# Patient Record
Sex: Male | Born: 1935 | Race: White | Hispanic: No | Smoking: Never smoker
Health system: Southern US, Community
[De-identification: ages and names within clinical notes are randomized; demographics above are authoritative.]

## PROBLEM LIST (undated history)

## (undated) DIAGNOSIS — N200 Calculus of kidney: Secondary | ICD-10-CM

## (undated) DIAGNOSIS — C189 Malignant neoplasm of colon, unspecified: Secondary | ICD-10-CM

## (undated) DIAGNOSIS — R634 Abnormal weight loss: Secondary | ICD-10-CM

## (undated) DIAGNOSIS — E785 Hyperlipidemia, unspecified: Secondary | ICD-10-CM

## (undated) DIAGNOSIS — C61 Malignant neoplasm of prostate: Secondary | ICD-10-CM

## (undated) DIAGNOSIS — I2510 Atherosclerotic heart disease of native coronary artery without angina pectoris: Secondary | ICD-10-CM

## (undated) DIAGNOSIS — I499 Cardiac arrhythmia, unspecified: Secondary | ICD-10-CM

## (undated) DIAGNOSIS — K219 Gastro-esophageal reflux disease without esophagitis: Secondary | ICD-10-CM

## (undated) DIAGNOSIS — M549 Dorsalgia, unspecified: Secondary | ICD-10-CM

## (undated) DIAGNOSIS — I739 Peripheral vascular disease, unspecified: Secondary | ICD-10-CM

## (undated) DIAGNOSIS — I10 Essential (primary) hypertension: Secondary | ICD-10-CM

## (undated) DIAGNOSIS — G4730 Sleep apnea, unspecified: Secondary | ICD-10-CM

## (undated) DIAGNOSIS — D649 Anemia, unspecified: Secondary | ICD-10-CM

## (undated) DIAGNOSIS — D509 Iron deficiency anemia, unspecified: Secondary | ICD-10-CM

## (undated) HISTORY — DX: Iron deficiency anemia, unspecified: D50.9

## (undated) HISTORY — DX: Abnormal weight loss: R63.4

## (undated) HISTORY — DX: Cardiac arrhythmia, unspecified: I49.9

## (undated) HISTORY — DX: Hyperlipidemia, unspecified: E78.5

## (undated) HISTORY — DX: Sleep apnea, unspecified: G47.30

## (undated) HISTORY — DX: Atherosclerotic heart disease of native coronary artery without angina pectoris: I25.10

## (undated) HISTORY — DX: Peripheral vascular disease, unspecified: I73.9

## (undated) HISTORY — PX: COLONOSCOPY: SHX174

## (undated) HISTORY — PX: KIDNEY STONE SURGERY: SHX686

## (undated) HISTORY — DX: Dorsalgia, unspecified: M54.9

## (undated) HISTORY — PX: APPENDECTOMY: SHX54

## (undated) HISTORY — DX: Calculus of kidney: N20.0

## (undated) HISTORY — DX: Malignant neoplasm of colon, unspecified: C18.9

## (undated) HISTORY — DX: Malignant neoplasm of prostate: C61

## (undated) HISTORY — PX: HEMORROIDECTOMY: SUR656

## (undated) HISTORY — PX: OTHER SURGICAL HISTORY: SHX169

## (undated) HISTORY — DX: Gastro-esophageal reflux disease without esophagitis: K21.9

## (undated) HISTORY — PX: CATARACT EXTRACTION: SUR2

## (undated) HISTORY — PX: CHOLECYSTECTOMY: SHX55

## (undated) HISTORY — PX: ANAL FISSURECTOMY: SUR608

## (undated) HISTORY — DX: Anemia, unspecified: D64.9

---

## 2009-04-29 ENCOUNTER — Ambulatory Visit: Payer: Self-pay | Admitting: Sports Medicine

## 2009-04-29 DIAGNOSIS — I714 Abdominal aortic aneurysm, without rupture, unspecified: Secondary | ICD-10-CM | POA: Insufficient documentation

## 2009-04-29 DIAGNOSIS — D518 Other vitamin B12 deficiency anemias: Secondary | ICD-10-CM

## 2009-04-29 DIAGNOSIS — M549 Dorsalgia, unspecified: Secondary | ICD-10-CM | POA: Insufficient documentation

## 2009-05-01 ENCOUNTER — Encounter: Payer: Self-pay | Admitting: Sports Medicine

## 2009-05-01 DIAGNOSIS — M1990 Unspecified osteoarthritis, unspecified site: Secondary | ICD-10-CM | POA: Insufficient documentation

## 2009-05-01 DIAGNOSIS — I10 Essential (primary) hypertension: Secondary | ICD-10-CM | POA: Insufficient documentation

## 2009-05-01 DIAGNOSIS — E119 Type 2 diabetes mellitus without complications: Secondary | ICD-10-CM | POA: Insufficient documentation

## 2009-05-01 DIAGNOSIS — Z8719 Personal history of other diseases of the digestive system: Secondary | ICD-10-CM

## 2009-05-01 DIAGNOSIS — E1169 Type 2 diabetes mellitus with other specified complication: Secondary | ICD-10-CM

## 2009-05-01 DIAGNOSIS — D509 Iron deficiency anemia, unspecified: Secondary | ICD-10-CM | POA: Insufficient documentation

## 2009-05-01 DIAGNOSIS — I2510 Atherosclerotic heart disease of native coronary artery without angina pectoris: Secondary | ICD-10-CM

## 2009-05-01 DIAGNOSIS — E785 Hyperlipidemia, unspecified: Secondary | ICD-10-CM

## 2009-05-01 DIAGNOSIS — M545 Low back pain: Secondary | ICD-10-CM

## 2009-06-04 ENCOUNTER — Ambulatory Visit: Payer: Self-pay | Admitting: Sports Medicine

## 2009-06-04 DIAGNOSIS — G252 Other specified forms of tremor: Secondary | ICD-10-CM

## 2009-06-04 DIAGNOSIS — G250 Essential tremor: Secondary | ICD-10-CM

## 2011-04-10 ENCOUNTER — Emergency Department (HOSPITAL_BASED_OUTPATIENT_CLINIC_OR_DEPARTMENT_OTHER)
Admission: EM | Admit: 2011-04-10 | Discharge: 2011-04-11 | Disposition: A | Discharge status: Other Acute Inpatient Hospital | Payer: PRIVATE HEALTH INSURANCE | Attending: Emergency Medicine | Admitting: Emergency Medicine

## 2011-04-10 ENCOUNTER — Emergency Department (INDEPENDENT_AMBULATORY_CARE_PROVIDER_SITE_OTHER): Payer: PRIVATE HEALTH INSURANCE

## 2011-04-10 ENCOUNTER — Encounter: Payer: Self-pay | Admitting: Emergency Medicine

## 2011-04-10 ENCOUNTER — Other Ambulatory Visit: Payer: Self-pay

## 2011-04-10 DIAGNOSIS — E119 Type 2 diabetes mellitus without complications: Secondary | ICD-10-CM | POA: Insufficient documentation

## 2011-04-10 DIAGNOSIS — R5381 Other malaise: Secondary | ICD-10-CM | POA: Insufficient documentation

## 2011-04-10 DIAGNOSIS — R5383 Other fatigue: Secondary | ICD-10-CM | POA: Insufficient documentation

## 2011-04-10 DIAGNOSIS — J189 Pneumonia, unspecified organism: Secondary | ICD-10-CM | POA: Insufficient documentation

## 2011-04-10 DIAGNOSIS — I10 Essential (primary) hypertension: Secondary | ICD-10-CM | POA: Insufficient documentation

## 2011-04-10 HISTORY — DX: Essential (primary) hypertension: I10

## 2011-04-10 LAB — URINALYSIS, ROUTINE W REFLEX MICROSCOPIC
Bilirubin Urine: NEGATIVE
Hgb urine dipstick: NEGATIVE
Ketones, ur: 40 mg/dL — AB
Nitrite: NEGATIVE
pH: 5 (ref 5.0–8.0)

## 2011-04-10 LAB — CBC
HCT: 40.5 % (ref 39.0–52.0)
MCHC: 34.6 g/dL (ref 30.0–36.0)
MCV: 90.2 fL (ref 78.0–100.0)
RDW: 13.1 % (ref 11.5–15.5)

## 2011-04-10 LAB — BASIC METABOLIC PANEL
BUN: 25 mg/dL — ABNORMAL HIGH (ref 6–23)
Chloride: 101 mEq/L (ref 96–112)
Creatinine, Ser: 1.2 mg/dL (ref 0.50–1.35)
GFR calc Af Amer: >60 mL/min (ref >60)
GFR calc non Af Amer: 58 mL/min — ABNORMAL LOW (ref >60)
Glucose, Bld: 200 mg/dL — ABNORMAL HIGH (ref 70–99)

## 2011-04-10 MED ORDER — SODIUM CHLORIDE 0.9 % IV BOLUS (SEPSIS)
500.0000 mL | Freq: Once | INTRAVENOUS | Status: AC
  Administered 2011-04-10: 500 mL via INTRAVENOUS

## 2011-04-10 MED ORDER — DEXTROSE 5 % IV SOLN
1.0000 g | Freq: Once | INTRAVENOUS | Status: AC
  Administered 2011-04-10: 1 g via INTRAVENOUS
  Filled 2011-04-10: qty 1

## 2011-04-10 MED ORDER — DEXTROSE 5 % IV SOLN
500.0000 mg | Freq: Once | INTRAVENOUS | Status: AC
  Administered 2011-04-10: 500 mg via INTRAVENOUS
  Filled 2011-04-10: qty 500

## 2011-04-10 MED ORDER — ACETAMINOPHEN 325 MG PO TABS
650.0000 mg | ORAL_TABLET | Freq: Once | ORAL | Status: AC
  Administered 2011-04-10: 650 mg via ORAL
  Filled 2011-04-10: qty 2

## 2011-04-10 NOTE — ED Notes (Signed)
MD at bedside. 

## 2011-04-10 NOTE — ED Notes (Signed)
Per EMS: cbg 205; EKG normal; 170/68, HR 88

## 2011-04-10 NOTE — ED Notes (Signed)
Pt c/o  Weakness, nausea, vomiting x 2 & diaphoresis onset 2 hrs ago; denies CP or SHOB

## 2011-04-10 NOTE — ED Provider Notes (Signed)
History     CSN: [id number] Arrival date & time: 04/10/2011  8:04 PM  Chief Complaint  Patient presents with  . Weakness   The history is provided by the patient.   Chief complaint: weakness and "shaking"  Onset: an hour ago Course - improving Worsened by: nothing Improved by eating food  Pt denies cp/sob/headache/abd pain/fever/vomiting/diarrhea  Pt reports he was at home and just "felt shaking" but no sz, no altered mental status Improved with taking PO - he is unsure his glucose at that time.   He reports decreased appetite today Past Medical History  Diagnosis Date  . Hypertension   . Diabetes mellitus     Past Surgical History  Procedure Date  . Appendectomy     No family history on file.  History  Substance Use Topics  . Smoking status: Never Smoker   . Smokeless tobacco: Not on file  . Alcohol Use: No      Review of Systems  All other systems reviewed and are negative.    Physical Exam  BP 152/62  Pulse 89  Temp(Src) 99.2 F (37.3 C) (Oral)  Resp 20  SpO2 100%   Physical Exam  CONSTITUTIONAL: Well developed/well nourished HEAD AND FACE: Normocephalic/atraumatic EYES: EOMI/PERRL ENMT: Mucous membranes moist NECK: supple no meningeal signs SPINE:entire spine nontender CV: S1/S2 noted, no murmurs/rubs/gallops noted LUNGS: Lungs are clear to auscultation bilaterally, no apparent distress ABDOMEN: soft, nontender, no rebound or guarding GU:no cva tenderness NEURO: Pt is awake/alert, moves all extremitiesx4 EXTREMITIES: pulses normal, full ROM SKIN: warm, color normal PSYCH: no abnormalities of mood noted  ED Course  Procedures  MDM Nursing notes reviewed and considered in documentation xrays reviewed and considered All labs/vitals reviewed and considered     Date: 04/10/2011  Rate: 89  Rhythm: normal sinus rhythm  QRS Axis: normal  Intervals: normal  ST/T Wave abnormalities: nonspecific ST changes  Conduction  Disutrbances:none  Narrative Interpretation:   Old EKG Reviewed: none available   Pt appears improved, though his CURB65 score elevated (2)which places him at high risk for mortality Pt slightly tachypneic, but not hypotensive, able to speak clearly I advised admission and he agreed D/w dr Lowell Guitar, at [facility], and he accepts for admission    Joya Gaskins, MD 04/10/11 2323

## 2011-04-10 NOTE — ED Notes (Signed)
Vital signs stable. 

## 2011-04-10 NOTE — ED Notes (Signed)
Patient is resting comfortably.Benefits of admission reviewed with pt and friends at side. IV infusion in progress.

## 2011-04-11 NOTE — ED Notes (Signed)
Pt verbalize3s benefits of Transfer and treatment

## 2011-06-14 ENCOUNTER — Ambulatory Visit: Payer: PRIVATE HEALTH INSURANCE | Attending: Internal Medicine | Admitting: Physical Therapy

## 2011-06-14 DIAGNOSIS — M6281 Muscle weakness (generalized): Secondary | ICD-10-CM | POA: Insufficient documentation

## 2011-06-14 DIAGNOSIS — M545 Low back pain, unspecified: Secondary | ICD-10-CM | POA: Insufficient documentation

## 2011-06-14 DIAGNOSIS — IMO0001 Reserved for inherently not codable concepts without codable children: Secondary | ICD-10-CM | POA: Insufficient documentation

## 2011-06-14 DIAGNOSIS — R293 Abnormal posture: Secondary | ICD-10-CM | POA: Insufficient documentation

## 2011-06-18 ENCOUNTER — Ambulatory Visit: Payer: PRIVATE HEALTH INSURANCE | Admitting: Physical Therapy

## 2011-06-22 ENCOUNTER — Ambulatory Visit: Payer: PRIVATE HEALTH INSURANCE | Admitting: Physical Therapy

## 2011-06-24 ENCOUNTER — Ambulatory Visit: Payer: PRIVATE HEALTH INSURANCE | Admitting: Physical Therapy

## 2011-06-29 ENCOUNTER — Ambulatory Visit: Payer: PRIVATE HEALTH INSURANCE | Admitting: Physical Therapy

## 2011-07-01 ENCOUNTER — Ambulatory Visit: Payer: PRIVATE HEALTH INSURANCE | Admitting: Physical Therapy

## 2011-07-06 ENCOUNTER — Ambulatory Visit: Payer: PRIVATE HEALTH INSURANCE | Admitting: Physical Therapy

## 2011-07-09 ENCOUNTER — Ambulatory Visit: Payer: PRIVATE HEALTH INSURANCE | Attending: Internal Medicine | Admitting: Physical Therapy

## 2011-07-09 DIAGNOSIS — R293 Abnormal posture: Secondary | ICD-10-CM | POA: Insufficient documentation

## 2011-07-09 DIAGNOSIS — IMO0001 Reserved for inherently not codable concepts without codable children: Secondary | ICD-10-CM | POA: Insufficient documentation

## 2011-07-09 DIAGNOSIS — M545 Low back pain, unspecified: Secondary | ICD-10-CM | POA: Insufficient documentation

## 2011-07-09 DIAGNOSIS — M6281 Muscle weakness (generalized): Secondary | ICD-10-CM | POA: Insufficient documentation

## 2011-07-13 ENCOUNTER — Ambulatory Visit: Payer: PRIVATE HEALTH INSURANCE | Admitting: Physical Therapy

## 2011-07-15 ENCOUNTER — Ambulatory Visit: Payer: PRIVATE HEALTH INSURANCE | Admitting: Physical Therapy

## 2011-07-19 ENCOUNTER — Ambulatory Visit: Payer: PRIVATE HEALTH INSURANCE | Admitting: Physical Therapy

## 2011-07-23 ENCOUNTER — Ambulatory Visit: Payer: PRIVATE HEALTH INSURANCE | Admitting: Physical Therapy

## 2013-01-16 IMAGING — CR DG CHEST 2V
2 series · 2 of 2 positions shown · non-contrast
Comparison: None.

CLINICAL DATA: Weakness.

CHEST - 2 VIEW

[w chest pa]
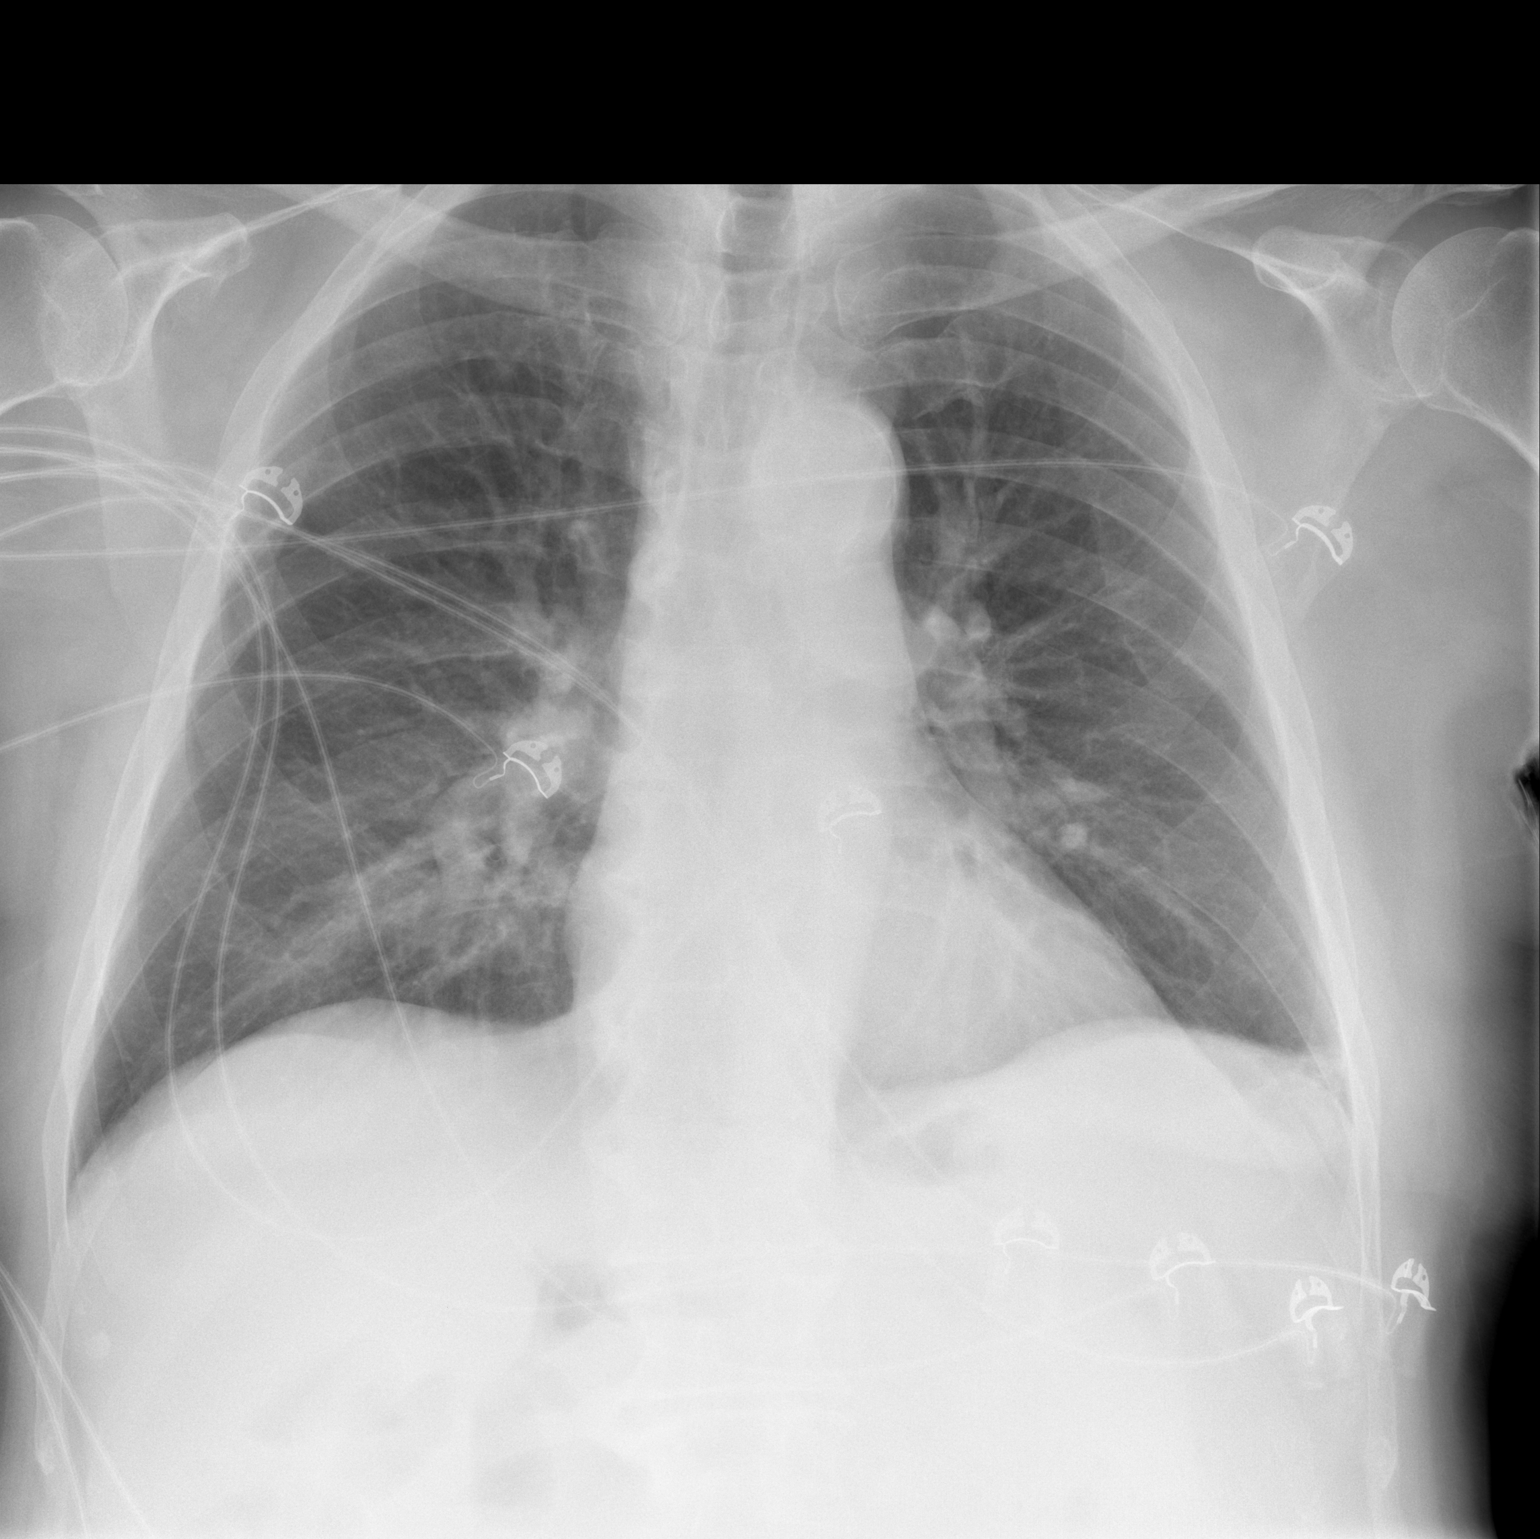

[w chest lat]
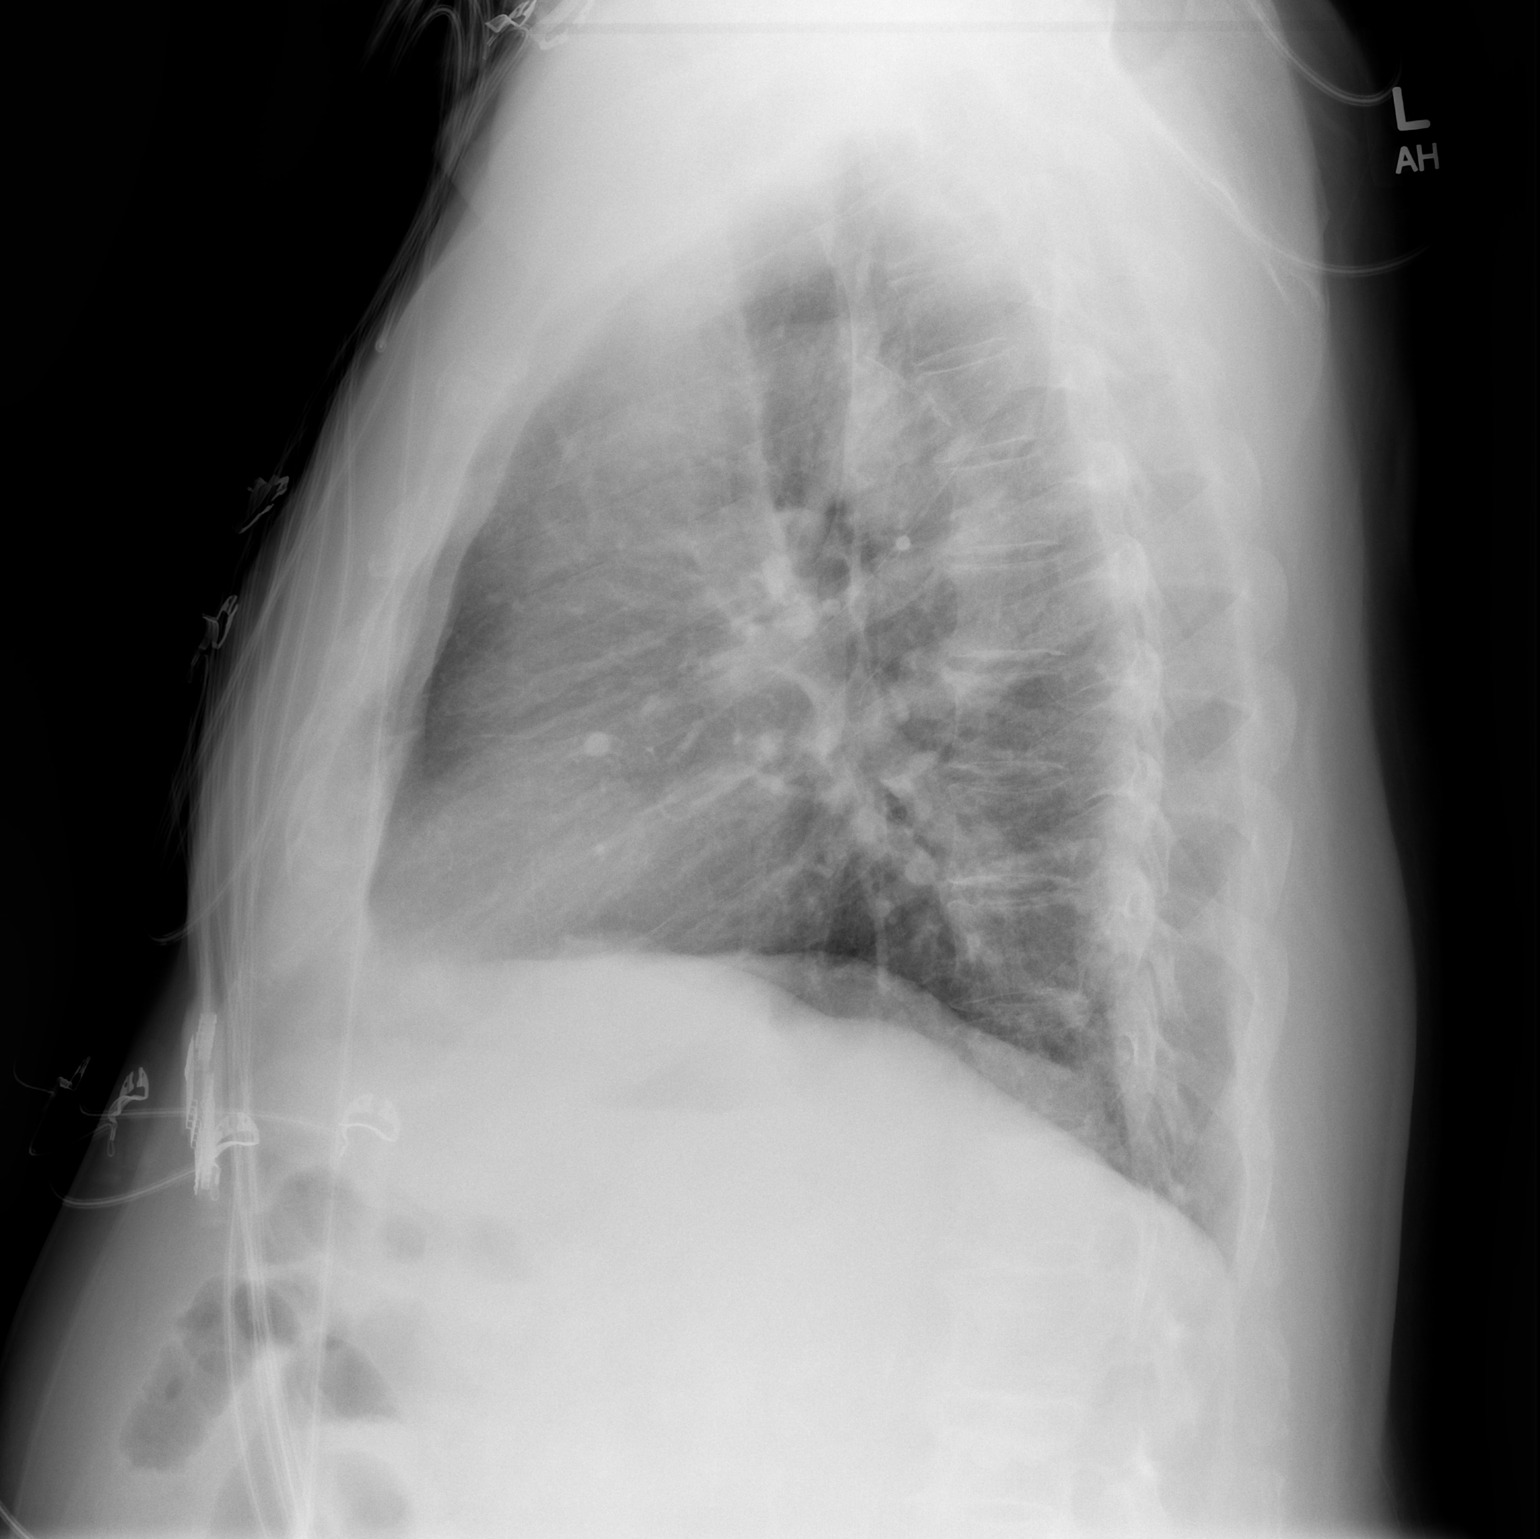

[2 of 2 positions shown; findings below may reference images not displayed]

FINDINGS: Heart is normal size.  Question right lower lobe airspace
disease.  Left lung is clear.  No effusions or acute bony
abnormality.
IMPRESSION: Question right lower lobe infiltrate.

## 2018-06-02 DIAGNOSIS — K8000 Calculus of gallbladder with acute cholecystitis without obstruction: Secondary | ICD-10-CM | POA: Insufficient documentation

## 2018-06-19 ENCOUNTER — Non-Acute Institutional Stay (SKILLED_NURSING_FACILITY): Payer: Medicare Other | Admitting: Internal Medicine

## 2018-06-19 ENCOUNTER — Encounter: Payer: Self-pay | Admitting: Internal Medicine

## 2018-06-19 DIAGNOSIS — R6520 Severe sepsis without septic shock: Secondary | ICD-10-CM

## 2018-06-19 DIAGNOSIS — A419 Sepsis, unspecified organism: Secondary | ICD-10-CM

## 2018-06-19 DIAGNOSIS — I2510 Atherosclerotic heart disease of native coronary artery without angina pectoris: Secondary | ICD-10-CM

## 2018-06-19 DIAGNOSIS — I480 Paroxysmal atrial fibrillation: Secondary | ICD-10-CM | POA: Diagnosis not present

## 2018-06-19 DIAGNOSIS — E1169 Type 2 diabetes mellitus with other specified complication: Secondary | ICD-10-CM

## 2018-06-19 DIAGNOSIS — E872 Acidosis, unspecified: Secondary | ICD-10-CM

## 2018-06-19 DIAGNOSIS — E559 Vitamin D deficiency, unspecified: Secondary | ICD-10-CM

## 2018-06-19 DIAGNOSIS — N170 Acute kidney failure with tubular necrosis: Secondary | ICD-10-CM | POA: Diagnosis not present

## 2018-06-19 DIAGNOSIS — E785 Hyperlipidemia, unspecified: Secondary | ICD-10-CM

## 2018-06-19 NOTE — Progress Notes (Signed)
:   Location:  [place] Room Number: 387F Place of Service:  SNF (31)  Irvan Tiedt D. [name], MD  Extended Emergency Contact Information Primary Emergency Contact: Fussell,James Address: [address]          [place], [place] [address] Johnnette Litter of [place] Phone: [phone] Relation: Son     Allergies: Shellfish allergy; Contrast media [iodinated diagnostic agents]; Iodine; Iodine-131; Metformin and related; and Shellfish-derived products  Chief Complaint  Patient presents with  . New Admit To SNF    Admit to [facility]    HPI: Patient is 84 y.o. male with anemia, cardiac arrhythmia, colon cancer, coronary artery disease, diabetes, GERD, hyperlipidemia, hypertension, malignant neoplasm of the prostate, sleep apnea, who was brought to the emergency room accompanied by his son who reported complaints of nausea vomiting poor appetite and diarrhea that started 4 days prior as well as a low-grade fever with chills..  In the ED patient's liver functions were elevated notably alk phos 196 and total bili 4.8 and CT the abdomen pelvis showed a dilated gallbladder with multiple stones and wall thickening and peri-cholestitis changes.  Patient was admitted to [facility] from 9/26 and discharged from [facility] on 10/11 where he was treated for severe sepsis secondary to acute cholecystitis.  He was hypotensive, tachycardic, low-grade fever, with acute kidney injury on presentation; blood cultures were collected and negative patient received 2 L IV fluid bolus and started on empiric IV Zosyn till 10/10 and sepsis resolved.  Patient had acute cholecystitis with biliary calculus.  A cholecystostomy tube was placed on 9/27, MRCP done on 10/7 which showed a tiny filling defect within the common bile duct and ERCP was done on 10/9 with sphincterotomy/papillotomy and removal of small stones.  Hospital course was comp gated by acute kidney injury,  likely ATN in the setting of sepsis with an accompanying metabolic acidosis.  Patient had a PermCath placed on 10/1 and had hemodialysis daily for 3 days with improvement.  Patient is admitted to skilled nursing facility for OT/PT and for care of biliary tube.  While at skilled nursing facility patient will be followed for hyperlipidemia treated with Lipitor, diabetes mellitus treated with Januvia, and a fibrillation treated with Xarelto and metoprolol.  Past Medical History:  Diagnosis Date  . Abnormal weight loss   . Anemia   . Arrhythmia   . Back pain   . CAD (coronary artery disease)   . Colon cancer ([place])   . Diabetes mellitus   . Gastroesophageal reflux   . Hyperlipidemia   . Hypertension   . Iron deficiency anemia   . Malignant neoplasm of prostate ([place])   . Nephrolithiasis   . Peripheral vascular disease ([place])   . Sleep apnea     Past Surgical History:  Procedure Laterality Date  . ANAL FISSURECTOMY    . APPENDECTOMY    . CATARACT EXTRACTION    . CHOLECYSTECTOMY    . COLONOSCOPY    . HEMORROIDECTOMY    . KIDNEY STONE SURGERY    . percutaneous lithotomy    . renal lithotripsy      Allergies as of 06/19/2018      Reactions   Shellfish Allergy Nausea And Vomiting   Contrast Media [iodinated Diagnostic Agents]    Iodine    Iodine-131    Metformin And Related    Shellfish-derived Products       Medication List  Accurate as of 06/19/18  3:12 PM. Always use your most recent med list.          acetaminophen 325 MG tablet Commonly known as:  TYLENOL Take 650 mg by mouth every 6 (six) hours as needed.   atorvastatin 40 MG tablet Commonly known as:  LIPITOR Take 40 mg by mouth daily.   Cholecalciferol 1000 units tablet Take 1,000 Units by mouth daily.   cyanocobalamin 1000 MCG tablet Take 1,000 mcg by mouth daily.   magnesium oxide 400 MG tablet Commonly known as:  MAG-OX Take 400 mg by mouth daily. For 14 days   metoprolol succinate 50 MG 24  hr tablet Commonly known as:  TOPROL-XL Take 50 mg by mouth daily. Take with or immediately following a meal.   ondansetron 4 MG tablet Commonly known as:  ZOFRAN Take 4 mg by mouth every 8 (eight) hours as needed for nausea or vomiting.   polyethylene glycol packet Commonly known as:  MIRALAX / GLYCOLAX Take 17 g by mouth 2 (two) times daily.   PRESERVISION AREDS 2 Caps Take by mouth.   ramipril 10 MG capsule Commonly known as:  ALTACE Take 10 mg by mouth daily.   sitaGLIPtin 100 MG tablet Commonly known as:  JANUVIA Take 100 mg by mouth daily.   XARELTO 20 MG Tabs tablet Generic drug:  rivaroxaban Take 20 mg by mouth daily with supper.       No orders of the defined types were placed in this encounter.    There is no immunization history on file for this patient.  Social History   Tobacco Use  . Smoking status: Never Smoker  . Smokeless tobacco: Never Used  Substance Use Topics  . Alcohol use: No    Family history is   Family History  Problem Relation Age of Onset  . Cancer Mother   . Hypertension Mother       Review of Systems  DATA OBTAINED: from patient, nurse GENERAL:  no fevers, fatigue, appetite changes SKIN: No itching, or rash EYES: No eye pain, redness, discharge EARS: No earache, tinnitus, change in hearing NOSE: No congestion, drainage or bleeding  MOUTH/THROAT: No mouth or tooth pain, No sore throat RESPIRATORY: No cough, wheezing, SOB CARDIAC: No chest pain, palpitations, lower extremity edema  GI: No abdominal pain, No N/V/D or constipation, No heartburn or reflux  GU: No dysuria, frequency or urgency, or incontinence  MUSCULOSKELETAL: No unrelieved bone/joint pain NEUROLOGIC: No headache, dizziness or focal weakness PSYCHIATRIC: No c/o anxiety or sadness   Vitals:   06/19/18 1510  BP: (!) 144/76  Pulse: 67  Resp: 18  Temp: (!) 97 F (36.1 C)    SpO2 Readings from Last 1 Encounters:  04/10/11 100%   Body mass index is  29.62 kg/m.     Physical Exam  GENERAL APPEARANCE: Alert, conversant,  No acute distress.  SKIN: No diaphoresis rash HEAD: Normocephalic, atraumatic  EYES: Conjunctiva/lids clear. Pupils round, reactive. EOMs intact.  EARS: External exam WNL, canals clear. Hearing grossly normal.  NOSE: No deformity or discharge.  MOUTH/THROAT: Lips w/o lesions  RESPIRATORY: Breathing is even, unlabored. Lung sounds are clear   CARDIOVASCULAR: Heart RRR no murmurs, rubs or gallops. No peripheral edema.   GASTROINTESTINAL: Abdomen is soft, non-tender, not distended w/ normal bowel sounds.;  Gastrostomy tube GENITOURINARY: Bladder non tender, not distended  MUSCULOSKELETAL: No abnormal joints or musculature NEUROLOGIC:  Cranial nerves 2-12 grossly intact. Moves all extremities  PSYCHIATRIC: Mood and affect  appropriate to situation, no behavioral issues  Patient Active Problem List   Diagnosis Date Noted  . TREMOR, ESSENTIAL 06/04/2009  . DIABETES MELLITUS, TYPE II 05/01/2009  . HYPERLIPIDEMIA 05/01/2009  . ANEMIA-IRON DEFICIENCY 05/01/2009  . HYPERTENSION 05/01/2009  . CORONARY ARTERY DISEASE 05/01/2009  . OSTEOARTHRITIS 05/01/2009  . LOW BACK PAIN 05/01/2009  . DIVERTICULITIS, HX OF 05/01/2009  . ANEMIA, VITAMIN B12 DEFICIENCY 04/29/2009  . ABDOMINAL ANEURYSM WITHOUT MENTION OF RUPTURE 04/29/2009  . BACK PAIN 04/29/2009      Labs reviewed: Basic Metabolic Panel:    Component Value Date/Time   NA 139 04/10/2011 2034   K 3.5 04/10/2011 2034   CL 101 04/10/2011 2034   CO2 25 04/10/2011 2034   GLUCOSE 200 (H) 04/10/2011 2034   BUN 25 (H) 04/10/2011 2034   CREATININE 1.20 04/10/2011 2034   CALCIUM 9.2 04/10/2011 2034   GFRNONAA 58 (L) 04/10/2011 2034   GFRAA >60 04/10/2011 2034    No results for input(s): NA, K, CL, CO2, GLUCOSE, BUN, CREATININE, CALCIUM, MG, PHOS in the last 8760 hours. Liver Function Tests: No results for input(s): AST, ALT, ALKPHOS, BILITOT, PROT, ALBUMIN in  the last 8760 hours. No results for input(s): LIPASE, AMYLASE in the last 8760 hours. No results for input(s): AMMONIA in the last 8760 hours. CBC: No results for input(s): WBC, NEUTROABS, HGB, HCT, MCV, PLT in the last 8760 hours. Lipid No results for input(s): CHOL, HDL, LDLCALC, TRIG in the last 8760 hours.  Cardiac Enzymes: No results for input(s): CKTOTAL, CKMB, CKMBINDEX, TROPONINI in the last 8760 hours. BNP: No results for input(s): BNP in the last 8760 hours. No results found for: MICROALBUR No results found for: HGBA1C No results found for: TSH No results found for: VITAMINB12 No results found for: FOLATE No results found for: IRON, TIBC, FERRITIN  Imaging and Procedures obtained prior to SNF admission: Dg Chest 2 View  Result Date: 04/10/2011 *RADIOLOGY REPORT* Clinical Data: Weakness. CHEST - 2 VIEW Comparison:  None. Findings: Heart is normal size.  Question right lower lobe airspace disease.  Left lung is clear.  No effusions or acute bony abnormality. IMPRESSION: Question right lower lobe infiltrate. Original Report Authenticated By: Raelyn Number, M.D.    Not all labs, radiology exams or other studies done during hospitalization come through on my EPIC note; however they are reviewed by me.    Assessment and Plan  Sepsis/acute cholecystitis- patient was hypotensive bradycardic low-grade fever with AKI (ATN) on presentation; blood cultures collected and negative no leukocytosis; received 2 L IV fluid bolus was on empiric IV Zosyn until 10/10 sepsis resolved SNF- admitted for OT/PT; will follow BMP for kidney function  Acute cholecystitis/biliary calculus- dilated gallbladder with multiple stones within an wall thickening, pericolic cystic inflammatory changes suggestive of acute cholecystitis; LFT elevated in particular T bili and alkaline phosphatase GI and EGS consulted and followed, no acute surgical intervention recommended IR was consulted and he had a  cholecystostomy tube placed 9/27, draining well; MRCP done on 10/7 which showed a tiny filling defect within the common bile duct; ERCP done on 10/9 sphincterotomy/papillotomy and removal of small stones; no apparent complications; treated with IV Zosyn until 10/10 with plan for cholecystectomy in 6 weeks SNF- we will follow-up CMP for liver functions  Acute kidney injury/metabolic acidosis- likely ATN; renal ultrasound showed no obstruction/hydronephrosis; avoid nephrotoxic medications patient had a permacath placed 10/1 and after that hemodialysis for 3 days and none since without problem SNF -we will follow-up  BMP for renal function  Paroxysmal atrial fibrillation-rate controlled with metoprolol 12.5 mg every 6 hours; INR at outside hospital seven-point 3 repeat INR of 2.06, was given a dose of vitamin K regardless no signs or symptoms of bleeding SNF- continue metoprolol XL 50 mg daily and Xarelto 20 mg daily as prophylaxis  Hypomagnesemia-supplemented with both p.o. and IV SNF- continue mag oxide 400 mg 1 p.o. daily for 14 days; follow magnesium level  Hyperlipidemia SNF- continue Lipitor 40 mg daily  Diabetes mellitus type 2 SNF- continue Januvia 100 mg daily; check blood sugars every morning  Vitamin D deficiency SNF- continue vitamin D 1000 units daily     Time spent greater than 45 minutes;> 50% of time with patient was spent reviewing records, labs, tests and studies, counseling and developing plan of care  Webb Silversmith D. [name], MD

## 2018-06-20 LAB — BASIC METABOLIC PANEL
BUN: 20 (ref 4–21)
Creatinine: 1.4 — AB (ref 0.6–1.3)
Glucose: 115
Potassium: 4.2 (ref 3.4–5.3)
Sodium: 139 (ref 137–147)

## 2018-06-20 LAB — CBC AND DIFFERENTIAL
HEMATOCRIT: 26 — AB (ref 41–53)
HEMOGLOBIN: 8.9 — AB (ref 13.5–17.5)
PLATELETS: 170 (ref 150–399)
WBC: 4.5

## 2018-06-24 ENCOUNTER — Encounter: Payer: Self-pay | Admitting: Internal Medicine

## 2018-06-24 DIAGNOSIS — E872 Acidosis, unspecified: Secondary | ICD-10-CM | POA: Insufficient documentation

## 2018-06-24 DIAGNOSIS — I480 Paroxysmal atrial fibrillation: Secondary | ICD-10-CM | POA: Insufficient documentation

## 2018-06-24 DIAGNOSIS — A419 Sepsis, unspecified organism: Secondary | ICD-10-CM | POA: Insufficient documentation

## 2018-06-24 DIAGNOSIS — E559 Vitamin D deficiency, unspecified: Secondary | ICD-10-CM | POA: Insufficient documentation

## 2018-06-24 DIAGNOSIS — N179 Acute kidney failure, unspecified: Secondary | ICD-10-CM | POA: Insufficient documentation

## 2018-07-04 ENCOUNTER — Non-Acute Institutional Stay (SKILLED_NURSING_FACILITY): Payer: Medicare Other | Admitting: Internal Medicine

## 2018-07-04 ENCOUNTER — Encounter: Payer: Self-pay | Admitting: Internal Medicine

## 2018-07-04 DIAGNOSIS — E1169 Type 2 diabetes mellitus with other specified complication: Secondary | ICD-10-CM

## 2018-07-04 DIAGNOSIS — N170 Acute kidney failure with tubular necrosis: Secondary | ICD-10-CM | POA: Diagnosis not present

## 2018-07-04 DIAGNOSIS — I2510 Atherosclerotic heart disease of native coronary artery without angina pectoris: Secondary | ICD-10-CM

## 2018-07-04 DIAGNOSIS — E872 Acidosis, unspecified: Secondary | ICD-10-CM

## 2018-07-04 DIAGNOSIS — E559 Vitamin D deficiency, unspecified: Secondary | ICD-10-CM

## 2018-07-04 DIAGNOSIS — A419 Sepsis, unspecified organism: Secondary | ICD-10-CM

## 2018-07-04 DIAGNOSIS — I480 Paroxysmal atrial fibrillation: Secondary | ICD-10-CM

## 2018-07-04 DIAGNOSIS — K8000 Calculus of gallbladder with acute cholecystitis without obstruction: Secondary | ICD-10-CM

## 2018-07-04 DIAGNOSIS — E785 Hyperlipidemia, unspecified: Secondary | ICD-10-CM

## 2018-07-04 DIAGNOSIS — R6520 Severe sepsis without septic shock: Secondary | ICD-10-CM

## 2018-07-04 NOTE — Progress Notes (Signed)
Location:  [place] Room Number: 503T Place of Service:  SNF (31)  Scott Gentry. Scott Coil, MD  Patient Care Team: Scott Duos, MD as PCP - General (Internal Medicine)  Extended Emergency Contact Information Primary Emergency Contact: Gentry,Scott Address: [address]          [place], [place] [address] Scott Gentry of [place] Phone: [phone] Mobile Phone: [phone] Relation: Son Secondary Emergency Contact: Gentry, Scott [place] Phone: [phone] Relation: Relative  Allergies  Allergen Reactions  . Shellfish Allergy Nausea And Vomiting  . Contrast Media [Iodinated Diagnostic Agents]   . Iodine   . Iodine-131   . Metformin And Related   . Shellfish-Derived Conservation officer, historic buildings Complaint  Patient presents with  . Discharge Note    Discharge from [facility]    HPI:  84 y.o. male with anemia, cardiac arrhythmia, colon cancer, coronary artery disease, diabetes, GERD, hyperlipidemia, hypertension, malignant neoplasm of the prostate, and sleep apnea who was brought to the emergency department accompanied by his son with reports of nausea vomiting, poor appetite and diarrhea that started 4 days prior as well as low-grade fever with chills.  Patient was admitted to [facility] from 9/26 and discharged from [facility] on 10/11 where he was treated for severe sepsis secondary to acute cholecystitis.  Patient was treated with IV Zosyn until 10/10 and sepsis resolved.  Patient had acute cholecystitis with biliary calculus.  A cholecystostomy tube was placed on 9/27, MRCP done on 10/7 which showed tiny filling defect and within the common bile duct and ERCP was done on 10/9 with sphincterotomy/papillotomy and removal of small stones.  Hospital course was complicated by acute kidney injury likely ATN, in the setting of sepsis with accompanying metabolic acidosis.  Patient had a PermCath placed on 10-1 and had to hemodialysis  daily for 3 days with improvement.  Patient was admitted to skilled nursing facility for OT/PT and care of biliary tube and is now ready to be discharged to home.    Past Medical History:  Diagnosis Date  . Abnormal weight loss   . Anemia   . Arrhythmia   . Back pain   . CAD (coronary artery disease)   . Colon cancer ([place])   . Diabetes mellitus   . Gastroesophageal reflux   . Hyperlipidemia   . Hypertension   . Iron deficiency anemia   . Malignant neoplasm of prostate ([place])   . Nephrolithiasis   . Peripheral vascular disease ([place])   . Sleep apnea     Past Surgical History:  Procedure Laterality Date  . ANAL FISSURECTOMY    . APPENDECTOMY    . CATARACT EXTRACTION    . CHOLECYSTECTOMY    . COLONOSCOPY    . HEMORROIDECTOMY    . KIDNEY STONE SURGERY    . percutaneous lithotomy    . renal lithotripsy       reports that he has never smoked. He has never used smokeless tobacco. He reports that he does not drink alcohol or use drugs. Social History   Socioeconomic History  . Marital status: Widowed    Spouse name: Not on file  . Number of children: Not on file  . Years of education: Not on file  . Highest education level: Not on file  Occupational History  . Not on file  Social Needs  . Financial resource strain: Not on file  . Food insecurity:  Worry: Not on file    Inability: Not on file  . Transportation needs:    Medical: Not on file    Non-medical: Not on file  Tobacco Use  . Smoking status: Never Smoker  . Smokeless tobacco: Never Used  Substance and Sexual Activity  . Alcohol use: No  . Drug use: No  . Sexual activity: Never  Lifestyle  . Physical activity:    Days per week: Not on file    Minutes per session: Not on file  . Stress: Not on file  Relationships  . Social connections:    Talks on phone: Not on file    Gets together: Not on file    Attends religious service: Not on file    Active member of club or organization: Not on file     Attends meetings of clubs or organizations: Not on file    Relationship status: Not on file  . Intimate partner violence:    Fear of current or ex partner: Not on file    Emotionally abused: Not on file    Physically abused: Not on file    Forced sexual activity: Not on file  Other Topics Concern  . Not on file  Social History Narrative  . Not on file    Pertinent  Health Maintenance Due  Topic Date Due  . HEMOGLOBIN A1C  [birth year]1928  . FOOT EXAM  11/30/1937  . OPHTHALMOLOGY EXAM  11/30/1937  . URINE MICROALBUMIN  [birth year]/30/1937  . PNA vac Low Risk Adult (1 of 2 - PCV13) 11/30/1992  . INFLUENZA VACCINE  04/06/2018    Medications: Allergies as of 07/04/2018      Reactions   Shellfish Allergy Nausea And Vomiting   Contrast Media [iodinated Diagnostic Agents]    Iodine    Iodine-131    Metformin And Related    Shellfish-derived Products       Medication List        Accurate as of 07/04/18 11:59 PM. Always use your most recent med list.          acetaminophen 325 MG tablet Commonly known as:  TYLENOL Take 650 mg by mouth every 6 (six) hours as needed.   atorvastatin 40 MG tablet Commonly known as:  LIPITOR Take 40 mg by mouth daily.   Cholecalciferol 1000 units tablet Take 1,000 Units by mouth daily.   GLUCERNA Liqd Take 237 mLs by mouth. QD D/T POOR APPETITE WITH DX MALNUTRITION   metoprolol succinate 50 MG 24 hr tablet Commonly known as:  TOPROL-XL Take 50 mg by mouth daily. Take with or immediately following a meal.   polyethylene glycol packet Commonly known as:  MIRALAX / GLYCOLAX Take 17 g by mouth 2 (two) times daily.   PRESERVISION AREDS 2 Caps Take by mouth.   sitaGLIPtin 100 MG tablet Commonly known as:  JANUVIA Take 100 mg by mouth daily.   XARELTO 20 MG Tabs tablet Generic drug:  rivaroxaban Take 20 mg by mouth daily with supper.        Vitals:   07/04/18 1100  BP: 125/77  Pulse: 70  Resp: 18  Temp: 98.3 F (36.8 C)  Weight: 194  lb (88 kg)  Height: 5\' 8"  (1.727 m)   Body mass index is 29.5 kg/m.  Physical Exam  GENERAL APPEARANCE: Alert, conversant. No acute distress.  HEENT: Unremarkable. RESPIRATORY: Breathing is even, unlabored. Lung sounds are clear   CARDIOVASCULAR: Heart RRR no murmurs, rubs or gallops. No peripheral edema.  GASTROINTESTINAL: Abdomen is soft, non-tender, not distended w/ normal bowel sounds.  NEUROLOGIC: Cranial nerves 2-12 grossly intact. Moves all extremities   Labs reviewed: Basic Metabolic Panel: Recent Labs    06/20/18  NA 139  K 4.2  BUN 20  CREATININE 1.4*   No results found for: [facility] Liver Function Tests: No results for input(s): AST, ALT, ALKPHOS, BILITOT, PROT, ALBUMIN in the last 8760 hours. No results for input(s): LIPASE, AMYLASE in the last 8760 hours. No results for input(s): AMMONIA in the last 8760 hours. CBC: Recent Labs    06/20/18  WBC 4.5  HGB 8.9*  HCT 26*  PLT 170   Lipid No results for input(s): CHOL, HDL, LDLCALC, TRIG in the last 8760 hours. Cardiac Enzymes: No results for input(s): CKTOTAL, CKMB, CKMBINDEX, TROPONINI in the last 8760 hours. BNP: No results for input(s): BNP in the last 8760 hours. CBG: No results for input(s): GLUCAP in the last 8760 hours.  Procedures and Imaging Studies During Stay: No results found.  Assessment/Plan:   Sepsis with acute renal failure and tubular necrosis without septic shock, due to unspecified organism [facility])  Acute cholecystitis due to biliary calculus  Acute renal failure with tubular necrosis (HCC)  Metabolic acidosis  Hypomagnesemia  Paroxysmal atrial fibrillation (HCC)  Vitamin D deficiency  Coronary artery disease involving native coronary artery of native heart without angina pectoris  Hyperlipidemia associated with type 2 diabetes mellitus ([place])   Patient is being discharged with the following home health services: OT/PT/nursing  Patient is being discharged with the  following durable medical equipment: None  Patient has been advised to f/u with their PCP in 1-2 weeks to bring them up to date on their rehab stay.  Social services at facility was responsible for arranging this appointment.  Pt was provided with a 30 day supply of prescriptions for medications and refills must be obtained from their PCP.  For controlled substances, a more limited supply may be provided adequate until PCP appointment only.  Medications have been reconciled.  Time spent greater than 30 minutes;> 50% of time with patient was spent reviewing records, labs, tests and studies, counseling and developing plan of care  Scott Gentry. Scott Coil, MD

## 2018-07-09 DIAGNOSIS — I480 Paroxysmal atrial fibrillation: Secondary | ICD-10-CM

## 2018-07-09 DIAGNOSIS — M1991 Primary osteoarthritis, unspecified site: Secondary | ICD-10-CM

## 2018-07-09 DIAGNOSIS — I10 Essential (primary) hypertension: Secondary | ICD-10-CM

## 2018-07-09 DIAGNOSIS — E785 Hyperlipidemia, unspecified: Secondary | ICD-10-CM

## 2018-07-09 DIAGNOSIS — G250 Essential tremor: Secondary | ICD-10-CM

## 2018-07-09 DIAGNOSIS — E1151 Type 2 diabetes mellitus with diabetic peripheral angiopathy without gangrene: Secondary | ICD-10-CM

## 2018-07-09 DIAGNOSIS — I2510 Atherosclerotic heart disease of native coronary artery without angina pectoris: Secondary | ICD-10-CM

## 2018-07-09 DIAGNOSIS — Z48815 Encounter for surgical aftercare following surgery on the digestive system: Secondary | ICD-10-CM | POA: Diagnosis not present

## 2019-03-12 ENCOUNTER — Other Ambulatory Visit: Payer: Self-pay

## 2019-10-08 DEATH — deceased
